# Patient Record
Sex: Male | Born: 1965 | Race: White | Hispanic: No | Marital: Married | Smoking: Never smoker
Health system: Southern US, Community
[De-identification: ages and names within clinical notes are randomized; demographics above are authoritative.]

## PROBLEM LIST (undated history)

## (undated) DIAGNOSIS — K219 Gastro-esophageal reflux disease without esophagitis: Secondary | ICD-10-CM

## (undated) DIAGNOSIS — E119 Type 2 diabetes mellitus without complications: Secondary | ICD-10-CM

## (undated) DIAGNOSIS — K760 Fatty (change of) liver, not elsewhere classified: Secondary | ICD-10-CM

## (undated) DIAGNOSIS — E785 Hyperlipidemia, unspecified: Secondary | ICD-10-CM

## (undated) DIAGNOSIS — I10 Essential (primary) hypertension: Secondary | ICD-10-CM

## (undated) HISTORY — DX: Type 2 diabetes mellitus without complications: E11.9

## (undated) HISTORY — DX: Fatty (change of) liver, not elsewhere classified: K76.0

## (undated) HISTORY — DX: Hyperlipidemia, unspecified: E78.5

## (undated) HISTORY — DX: Essential (primary) hypertension: I10

## (undated) HISTORY — DX: Gastro-esophageal reflux disease without esophagitis: K21.9

---

## 1983-02-04 HISTORY — PX: OTHER SURGICAL HISTORY: SHX169

## 1985-02-03 HISTORY — PX: SKIN GRAFT: SHX250

## 2003-02-04 DIAGNOSIS — I10 Essential (primary) hypertension: Secondary | ICD-10-CM | POA: Insufficient documentation

## 2003-11-13 ENCOUNTER — Ambulatory Visit: Payer: Self-pay

## 2004-02-04 DIAGNOSIS — E1129 Type 2 diabetes mellitus with other diabetic kidney complication: Secondary | ICD-10-CM | POA: Insufficient documentation

## 2006-02-03 DIAGNOSIS — M109 Gout, unspecified: Secondary | ICD-10-CM | POA: Insufficient documentation

## 2007-09-23 DIAGNOSIS — E782 Mixed hyperlipidemia: Secondary | ICD-10-CM | POA: Insufficient documentation

## 2010-12-17 ENCOUNTER — Ambulatory Visit: Payer: Self-pay | Admitting: Family Medicine

## 2012-03-12 ENCOUNTER — Ambulatory Visit: Payer: Self-pay | Admitting: Family Medicine

## 2012-03-12 DIAGNOSIS — K760 Fatty (change of) liver, not elsewhere classified: Secondary | ICD-10-CM | POA: Insufficient documentation

## 2012-03-12 DIAGNOSIS — N281 Cyst of kidney, acquired: Secondary | ICD-10-CM | POA: Insufficient documentation

## 2013-03-30 LAB — LIPID PANEL
CHOLESTEROL: 189 mg/dL (ref 0–200)
HDL: 53 mg/dL (ref 35–70)
LDL CALC: 109 mg/dL
TRIGLYCERIDES: 134 mg/dL (ref 40–160)

## 2013-03-30 LAB — HEPATIC FUNCTION PANEL: ALT: 57 U/L — AB (ref 10–40)

## 2013-03-30 LAB — PSA: PSA: 0.5

## 2013-10-25 IMAGING — US ABDOMEN ULTRASOUND LIMITED
1 series · 13 of 25 positions shown · non-contrast
Comparison: none

REASON FOR EXAM: Elevated LFTS
COMMENTS:

[Series 1: abdomen ultrasound limited · 0.35mm/px · 13 of 117 slices shown]
[im 1/117]
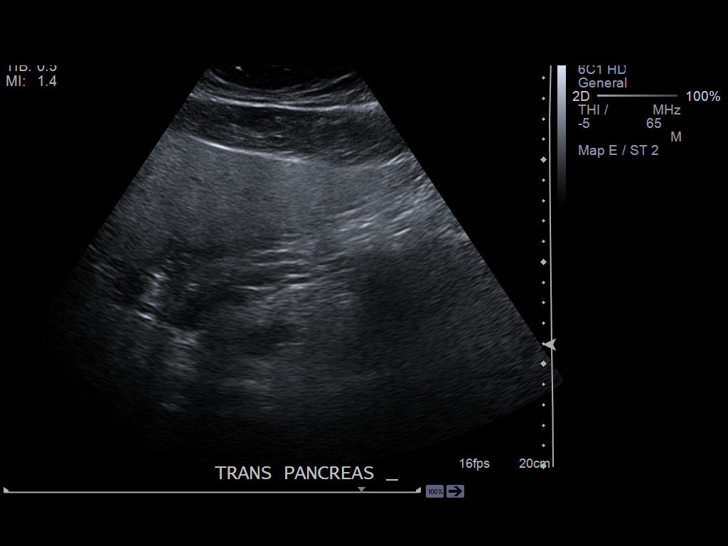
[im 10/117]
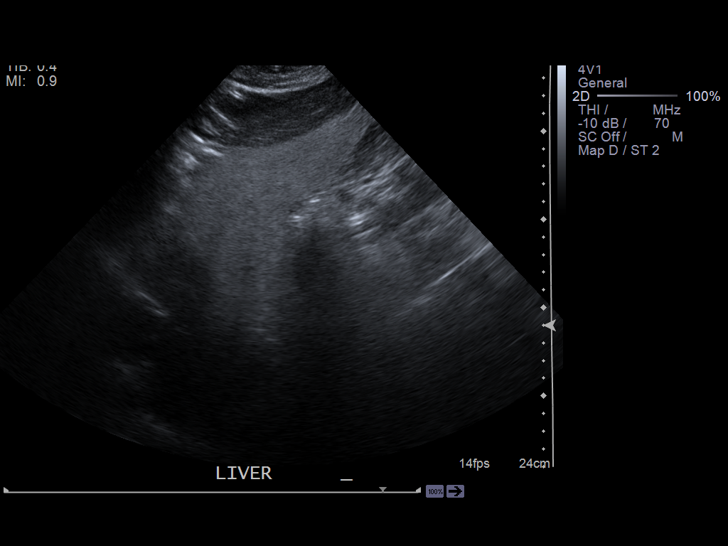
[im 20/117]
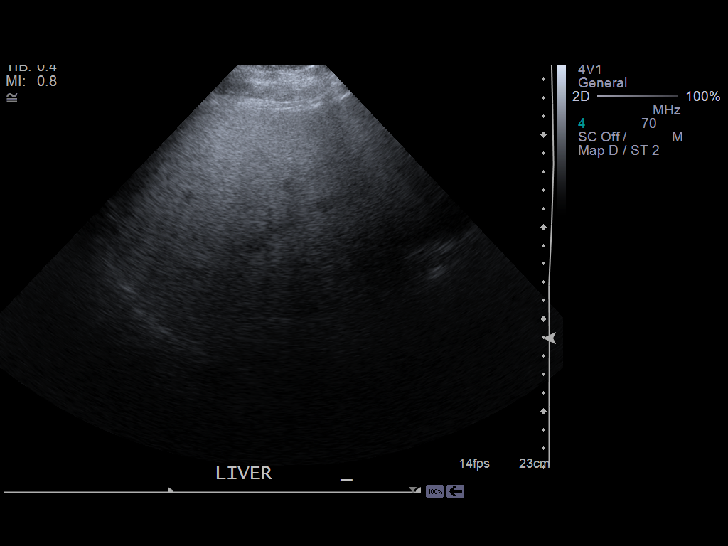
[im 30/117]
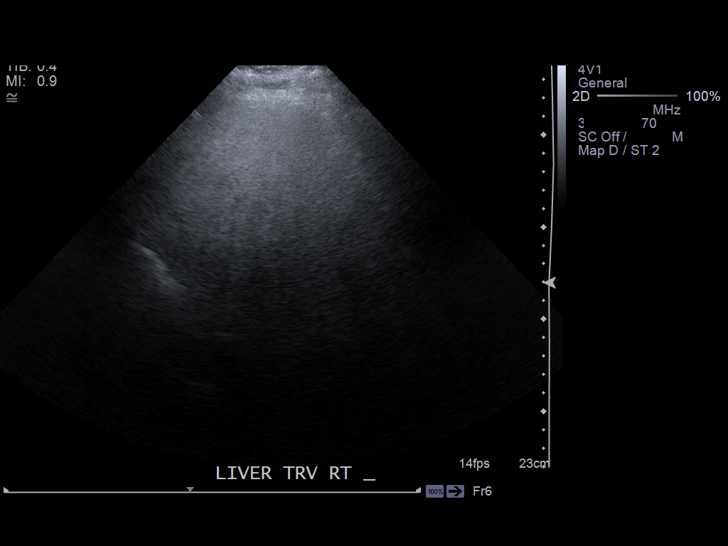
[im 39/117]
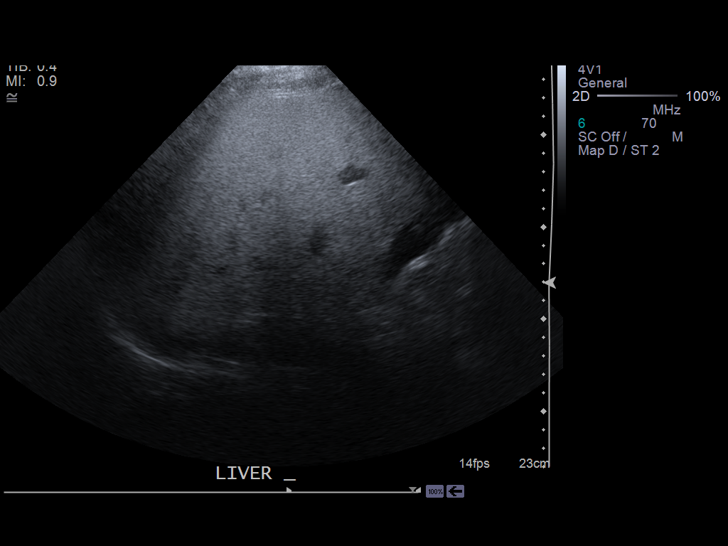
[im 49/117]
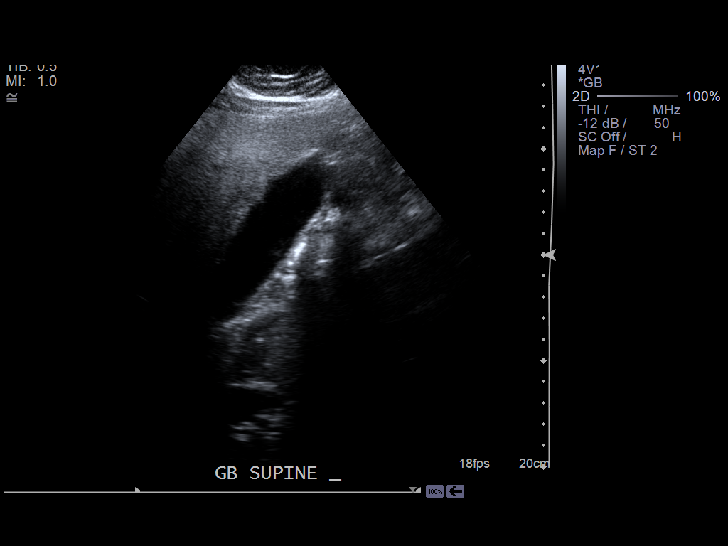
[im 59/117]
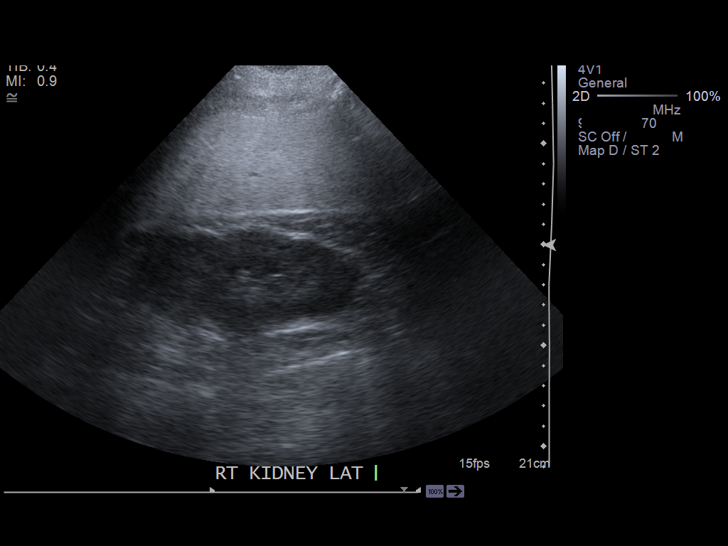
[im 68/117]
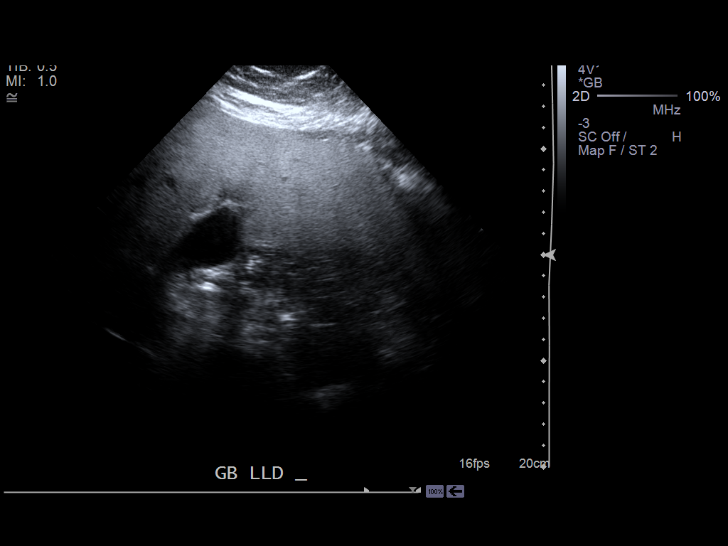
[im 78/117]
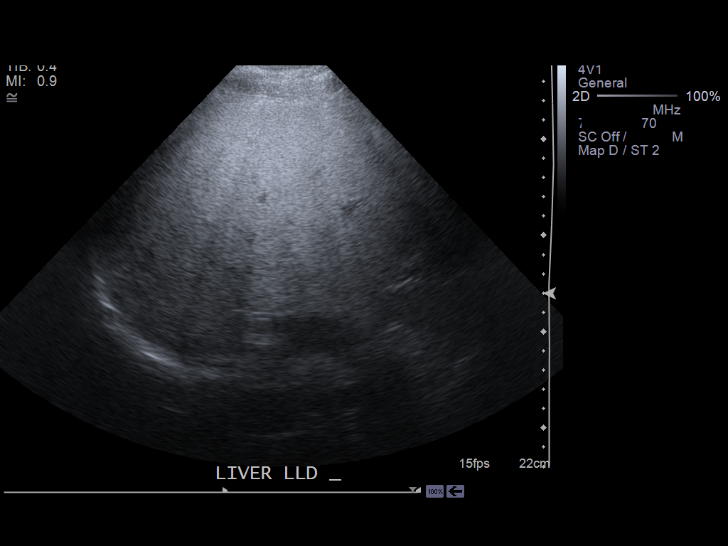
[im 88/117]
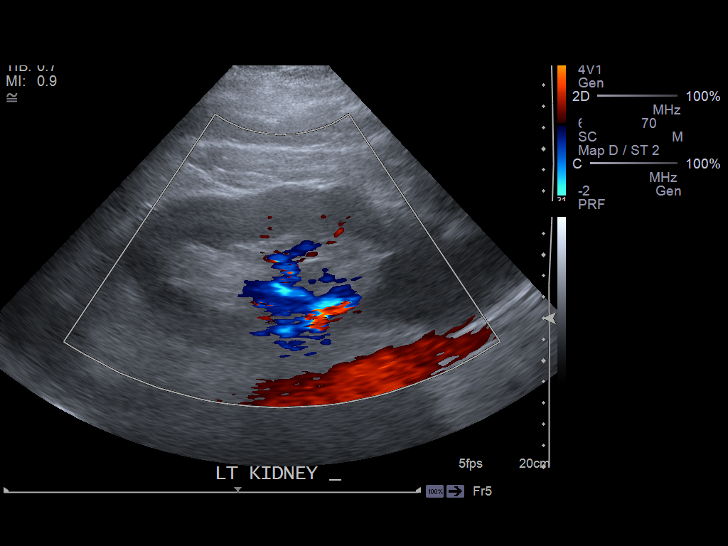
[im 97/117]
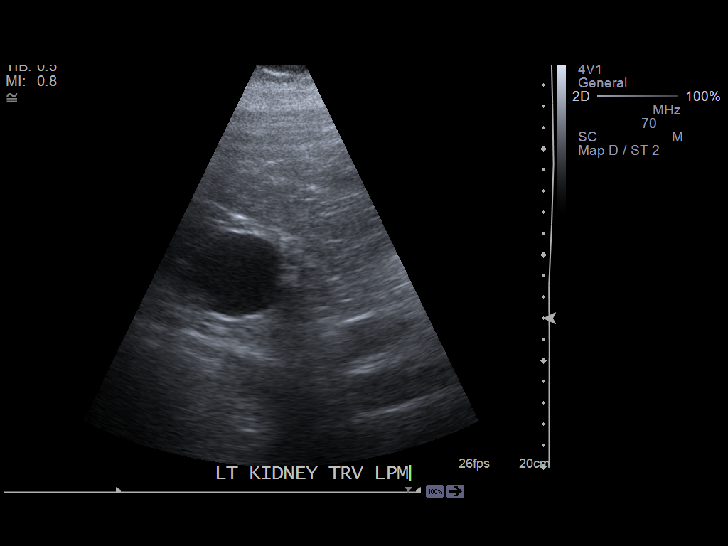
[im 107/117]
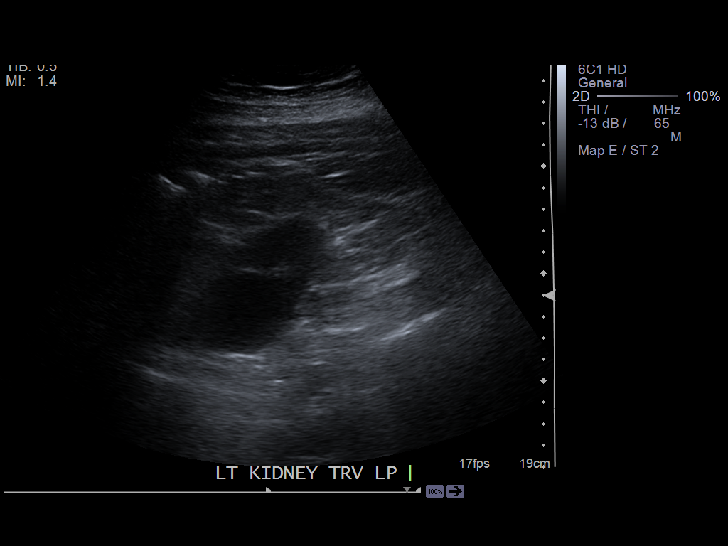
[im 117/117]
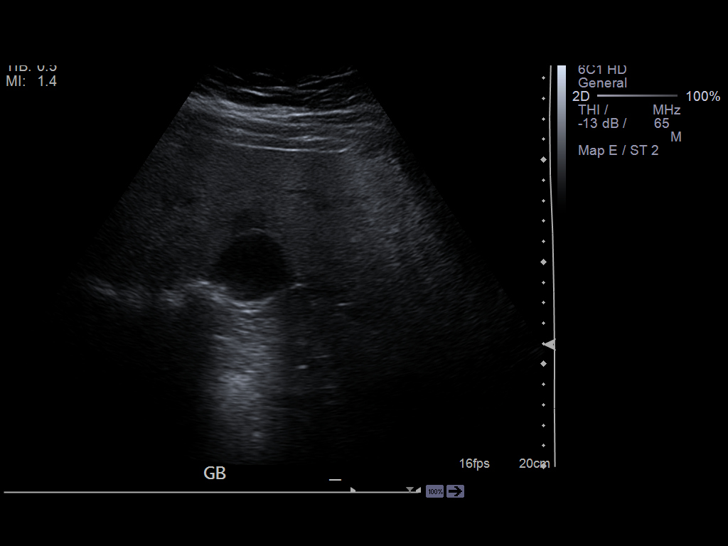

[13 of 25 positions shown; findings below may reference images not displayed]

PROCEDURE:     FUJIE - FUJIE ABDOMEN UPPER GENERAL  - March 12, 2012  [DATE]

RESULT:     The study is limited by the patient's body habitus.

The echotexture of the liver is increased suggesting fatty infiltration. The
liver is subjectively enlarged with maximal measurement of 21.6 cm. There is
no intrahepatic ductal dilation. Portal venous flow is normal in direction
toward the liver.

The gallbladder is adequately distended with no evidence of stones, wall
thickening, or pericholecystic fluid. There is no positive sonographic
Murphy's sign. The common bile duct is normal at 5.3 mm in diameter.

The pancreas was only partially demonstrated due to the presence of bowel
gas. The spleen is mildly enlarged at 14.7 cm in greatest dimension. Bowel
gas limited evaluation of the abdominal aorta.

The kidneys are normal in size and echotexture. The right kidney measures
13.6 x 5.5 x 5.6 cm. The left kidney measures 14.1 x 5.2 x 5.3 cm. There is
an exophytic cyst in the lower pole of the left kidney which measures 7.9 x
4.2 x 4.2 centimeters. There is no evidence of ascites.
IMPRESSION: 1. The liver is mildly enlarged and demonstrates increased echotexture
suggesting fatty infiltration.
2. The gallbladder and visualized portions of the pancreas are normal.
3. There is mild splenomegaly to 14.7 cm.
4. There is a large exophytic cyst associated with the lower pole of the
left kidney measuring 7.9 cm in greatest dimension.

[REDACTED]

## 2013-12-08 LAB — HEMOGLOBIN A1C: HEMOGLOBIN A1C: 9.4

## 2013-12-27 LAB — BASIC METABOLIC PANEL
BUN: 11 mg/dL (ref 4–21)
Creatinine: 0.9 mg/dL (ref 0.6–1.3)
Glucose: 144 mg/dL
Potassium: 4.7 mmol/L (ref 3.4–5.3)
Sodium: 138 mmol/L (ref 137–147)

## 2014-07-24 ENCOUNTER — Telehealth: Payer: Self-pay | Admitting: Family Medicine

## 2014-07-24 MED ORDER — EMPAGLIFLOZIN 25 MG PO TABS: 25.0000 mg | ORAL_TABLET | Freq: Every day | ORAL | Status: DC

## 2014-07-24 NOTE — Telephone Encounter (Signed)
Pt stated that his insurance no longer covers Invokana 300 mg and pt would like a RX for Jardiance be sent to [facility] today if possible. Thanks TNP

## 2014-08-04 ENCOUNTER — Other Ambulatory Visit: Payer: Self-pay | Admitting: Family Medicine

## 2014-08-04 DIAGNOSIS — E1129 Type 2 diabetes mellitus with other diabetic kidney complication: Secondary | ICD-10-CM

## 2014-08-04 NOTE — Telephone Encounter (Signed)
Pharm[facility] request refill. [facility]

## 2014-08-06 ENCOUNTER — Other Ambulatory Visit: Payer: Self-pay | Admitting: Family Medicine

## 2014-10-12 ENCOUNTER — Other Ambulatory Visit: Payer: Self-pay | Admitting: Family Medicine

## 2014-10-31 ENCOUNTER — Other Ambulatory Visit: Payer: Self-pay | Admitting: Family Medicine

## 2014-12-03 ENCOUNTER — Other Ambulatory Visit: Payer: Self-pay | Admitting: Family Medicine

## 2015-01-02 LAB — HM DIABETES EYE EXAM

## 2015-01-10 ENCOUNTER — Encounter: Payer: Self-pay | Admitting: *Deleted

## 2015-01-16 ENCOUNTER — Other Ambulatory Visit: Payer: Self-pay | Admitting: Family Medicine

## 2015-01-16 NOTE — Telephone Encounter (Signed)
Patient is overdue for follow up o.v.  Needs to schedule before refills can be approved.

## 2015-01-19 ENCOUNTER — Other Ambulatory Visit: Payer: Self-pay | Admitting: *Deleted

## 2015-01-19 ENCOUNTER — Encounter: Payer: Self-pay | Admitting: Family Medicine

## 2015-01-19 NOTE — Telephone Encounter (Signed)
Last ov 05/11/2014.

## 2015-01-22 MED ORDER — ONETOUCH VERIO VI STRP: ORAL_STRIP | Status: DC

## 2015-01-22 MED ORDER — EMPAGLIFLOZIN 25 MG PO TABS: 25.0000 mg | ORAL_TABLET | Freq: Every day | ORAL | Status: DC

## 2015-01-24 ENCOUNTER — Other Ambulatory Visit: Payer: Self-pay | Admitting: Family Medicine

## 2015-01-25 NOTE — Telephone Encounter (Signed)
Patient is requesting a 90 day supply of [facility]. Patient states he went to pick up the RX but it was only a 30 day supply which cost the same as a 90 day supply.

## 2015-01-26 ENCOUNTER — Telehealth: Payer: Self-pay | Admitting: Emergency Medicine

## 2015-01-26 ENCOUNTER — Other Ambulatory Visit: Payer: Self-pay | Admitting: Family Medicine

## 2015-01-26 MED ORDER — EMPAGLIFLOZIN 25 MG PO TABS: 25.0000 mg | ORAL_TABLET | Freq: Every day | ORAL | Status: DC

## 2015-01-26 NOTE — Telephone Encounter (Signed)
Pt called back again regarding his 90 day supply for Jardiance 25mg . It was sent in for 30 days again. I saw that you said he needed an appointment. I informed pt and he scheduled an appointment and then asked again if he could the 90 day supply. Pt states "Ok fine ill make an appointment and then he can send in my 5290 pills" His appt is 02/28/15. He says that it is the same cost to get 90 as it is 30 pills. Pt states "If he can not do that after I made an appointment, then I need to speak with him, because this is my fourth time call about this."

## 2015-01-26 NOTE — Telephone Encounter (Signed)
Patient is completeley out of medication. He reports that he needs 90 days of Jardiance 25mg  sent in because it costs the same as a 30 day supply. [facility]. Thanks!

## 2015-01-26 NOTE — Telephone Encounter (Signed)
Please see message phone message from earlier today.  Thanks,   -Vernona RiegerLaura

## 2015-02-22 DIAGNOSIS — K219 Gastro-esophageal reflux disease without esophagitis: Secondary | ICD-10-CM | POA: Insufficient documentation

## 2015-02-22 DIAGNOSIS — G5620 Lesion of ulnar nerve, unspecified upper limb: Secondary | ICD-10-CM | POA: Insufficient documentation

## 2015-02-28 ENCOUNTER — Encounter: Payer: Self-pay | Admitting: Family Medicine

## 2015-02-28 ENCOUNTER — Ambulatory Visit (INDEPENDENT_AMBULATORY_CARE_PROVIDER_SITE_OTHER): Payer: 59 | Admitting: Family Medicine

## 2015-02-28 VITALS — BP 126/60 | HR 72 | Temp 98.0°F | Resp 16 | Wt 298.0 lb

## 2015-02-28 DIAGNOSIS — E782 Mixed hyperlipidemia: Secondary | ICD-10-CM

## 2015-02-28 DIAGNOSIS — E1129 Type 2 diabetes mellitus with other diabetic kidney complication: Secondary | ICD-10-CM | POA: Diagnosis not present

## 2015-02-28 DIAGNOSIS — E1165 Type 2 diabetes mellitus with hyperglycemia: Secondary | ICD-10-CM

## 2015-02-28 DIAGNOSIS — IMO0002 Reserved for concepts with insufficient information to code with codable children: Secondary | ICD-10-CM

## 2015-02-28 DIAGNOSIS — Z794 Long term (current) use of insulin: Secondary | ICD-10-CM

## 2015-02-28 DIAGNOSIS — K760 Fatty (change of) liver, not elsewhere classified: Secondary | ICD-10-CM | POA: Diagnosis not present

## 2015-02-28 DIAGNOSIS — I10 Essential (primary) hypertension: Secondary | ICD-10-CM

## 2015-02-28 LAB — POCT GLYCOSYLATED HEMOGLOBIN (HGB A1C)
ESTIMATED AVERAGE GLUCOSE: 146
HEMOGLOBIN A1C: 6.7

## 2015-02-28 NOTE — Progress Notes (Signed)
Patient ID: Christopher Wise, male   DOB: October 08, 1965, 50 y.o.   MRN: [id number]       Patient: Christopher Wise Male    DOB: [birth year]   50 y.o.   MRN: [id number] Visit Date: 02/28/2015  Today's Provider: Mila Merry, MD   Chief Complaint  Patient presents with  . Hyperlipidemia  . Gastroesophageal Reflux  . Diabetes  . Hypertension   Subjective:    HPI  Diabetes Mellitus Type II, Follow-up:   Lab Results  Component Value Date   HGBA1C 6.7 02/28/2015   HGBA1C 9.4 12/08/2013   Last seen for diabetes 9 months ago.  Management since then includes no changes. Patient reports that he will need to change the Lantus due to it not being covered by his insurance.  He reports good compliance with treatment. He is not having side effects.  Home blood sugar records: "not good" per patient.  Episodes of hypoglycemia? no   Current Insulin Regimen: daily  Most Recent Eye Exam: over 1 year ago.  Weight trend: stable Prior visit with dietician: no Current diet: well balanced Current exercise: none  ------------------------------------------------------------------------   Hypertension, follow-up:  BP Readings from Last 3 Encounters:  02/28/15 126/60  05/11/14 130/80    He was last seen for hypertension 9 months ago.  BP at that visit was 130/80. Management since that visit includes no changes .He reports good compliance with treatment. He is not having side effects.  He is not exercising. He is not adherent to low salt diet.   Outside blood pressures are not being checked. Patient denies chest pain and fatigue.   Cardiovascular risk factors include diabetes mellitus and dyslipidemia.    ------------------------------------------------------------------------    Lipid/Cholesterol, Follow-up:   Last seen for this 9 months ago.  Management since that visit includes no changes.  Last Lipid Panel:    Component Value Date/Time   CHOL 189 03/30/2013   TRIG 134  03/30/2013   HDL 53 03/30/2013   LDLCALC 109 03/30/2013    He reports good compliance with treatment. He is not having side effects.   Wt Readings from Last 3 Encounters:  02/28/15 298 lb (135.172 kg)  05/11/14 296 lb (134.265 kg)    ------------------------------------------------------------------------      No Known Allergies Previous Medications   ALLOPURINOL (ZYLOPRIM) 100 MG TABLET    Take 1 tablet by mouth daily.   ASPIRIN 81 MG TABLET    Take 1 tablet by mouth daily.   ATORVASTATIN (LIPITOR) 40 MG TABLET    TAKE 1 TABLET BY MOUTH DAILY   CANAGLIFLOZIN (INVOKANA) 300 MG TABS TABLET    Take 1 tablet by mouth daily.   CHOLECALCIFEROL (VITAMIN D) 2000 UNITS CAPS    Take 1 capsule by mouth daily.   EMPAGLIFLOZIN (JARDIANCE) 25 MG TABS TABLET    Take 25 mg by mouth daily.   GLIMEPIRIDE (AMARYL) 4 MG TABLET    TAKE 1 TABLET BY MOUTH ONCE A DAY   INDAPAMIDE (LOZOL) 2.5 MG TABLET    TAKE 1 TABLET BY MOUTH DAILY   INSULIN ASPART (NOVOLOG FLEXPEN) 100 UNIT/ML FLEXPEN    Inject 10 Units into the skin as needed. Before meals for blood sugar >200   LANTUS SOLOSTAR 100 UNIT/ML SOLOSTAR PEN    INJECT UP TO 50 UNITS SUBCUTANEOUSLY TITRATED AS DIRECTED EACH DAY   LOSARTAN (COZAAR) 100 MG TABLET    Take 1 tablet by mouth daily.   METFORMIN (GLUCOPHAGE) 500 MG TABLET  TAKE 2 TABLETS BY MOUTH TWICE A DAY   OMEGA-3 FATTY ACIDS (FISH OIL) 1000 MG CAPS    Take 1 capsule by mouth daily.   OMEPRAZOLE (PRILOSEC) 20 MG CAPSULE    TAKE ONE CAPSULE BY MOUTH DAILY   ONETOUCH VERIO TEST STRIP    TEST 3 TIMES A DAY AS DIRECTED   PREDNISONE (DELTASONE) 10 MG TABLET    Take 1 tablet by mouth. As directed   VITAMIN B-12 (CYANOCOBALAMIN) 1000 MCG TABLET    Take 1 tablet by mouth daily.    Review of Systems  Constitutional: Negative.   Respiratory: Negative.   Cardiovascular: Negative.   Endocrine: Negative.   Musculoskeletal: Negative.   Neurological: Negative.     Social History  Substance Use  Topics  . Smoking status: Never Smoker   . Smokeless tobacco: Not on file  . Alcohol Use: 0.0 oz/week    0 Standard drinks or equivalent per week     Comment: Moderate use   Objective:   BP 126/60 mmHg  Pulse 72  Temp(Src) 98 F (36.7 C)  Resp 16  Wt 298 lb (135.172 kg)  Physical Exam  General Appearance:    Alert, cooperative, no distress, obese  Eyes:    PERRL, conjunctiva/corneas clear, EOM's intact       Lungs:     Clear to auscultation bilaterally, respirations unlabored  Heart:    Regular rate and rhythm  Neurologic:   Awake, alert, oriented x 3. No apparent focal neurological           defect.        Results for orders placed or performed in visit on 02/28/15  POCT glycosylated hemoglobin (Hb A1C)  Result Value Ref Range   Hemoglobin A1C 6.7    Est. average glucose Bld gHb Est-mCnc 146        Assessment & Plan:     1. 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 Continue current medications.  - POCT glycosylated hemoglobin (Hb A1C)  2. Essential (primary) hypertension Well controlled.  Continue current medications.   - Renal function panel  3. Fatty infiltration of liver  - Hepatic function panel  4. Hyperlipidemia, mixed He is tolerating atorvastatin well with no adverse effects.   - Lipid panel  Follow up 6 months.       Mila Merry, MD  [facility]

## 2015-03-02 ENCOUNTER — Telehealth: Payer: Self-pay

## 2015-03-02 LAB — RENAL FUNCTION PANEL
Albumin: 4.5 g/dL (ref 3.5–5.5)
BUN/Creatinine Ratio: 11 (ref 9–20)
BUN: 8 mg/dL (ref 6–24)
CALCIUM: 9.8 mg/dL (ref 8.7–10.2)
CHLORIDE: 98 mmol/L (ref 96–106)
CO2: 26 mmol/L (ref 18–29)
CREATININE: 0.75 mg/dL — AB (ref 0.76–1.27)
GFR calc Af Amer: 123 mL/min/{1.73_m2} (ref >59)
GFR calc non Af Amer: 106 mL/min/{1.73_m2} (ref >59)
Glucose: 96 mg/dL (ref 65–99)
PHOSPHORUS: 3.1 mg/dL (ref 2.5–4.5)
Potassium: 4.6 mmol/L (ref 3.5–5.2)
SODIUM: 140 mmol/L (ref 134–144)

## 2015-03-02 LAB — HEPATIC FUNCTION PANEL
ALT: 62 IU/L — ABNORMAL HIGH (ref 0–44)
AST: 42 IU/L — AB (ref 0–40)
Alkaline Phosphatase: 89 IU/L (ref 39–117)
BILIRUBIN TOTAL: 0.4 mg/dL (ref 0.0–1.2)
Bilirubin, Direct: 0.13 mg/dL (ref 0.00–0.40)
TOTAL PROTEIN: 7 g/dL (ref 6.0–8.5)

## 2015-03-02 LAB — LIPID PANEL
CHOL/HDL RATIO: 3.2 ratio (ref 0.0–5.0)
CHOLESTEROL TOTAL: 170 mg/dL (ref 100–199)
HDL: 53 mg/dL (ref >39)
LDL Calculated: 86 mg/dL (ref 0–99)
TRIGLYCERIDES: 153 mg/dL — AB (ref 0–149)
VLDL Cholesterol Cal: 31 mg/dL (ref 5–40)

## 2015-03-02 NOTE — Telephone Encounter (Signed)
-----   Message from Malva Limes, MD sent at 03/02/2015  8:08 AM EST ----- Cholesterol is well controlled at 170. Normal kidney functions and electrolytes. Liver functions slightly elevated, but stable. Continue current medications.  Follow up 6 months.

## 2015-03-02 NOTE — Telephone Encounter (Signed)
Advised patient of results.  

## 2015-03-02 NOTE — Telephone Encounter (Signed)
Left message to call back  

## 2015-03-08 ENCOUNTER — Other Ambulatory Visit: Payer: Self-pay | Admitting: Family Medicine

## 2015-03-08 NOTE — Telephone Encounter (Signed)
Pt contacted office for refill request on the following medications: Pt stated that his insurance won't pay for Lantus and [facility] and the following medications:  Allopurinol atorvastatin (LIPITOR) 40 MG tablet empagliflozin (JARDIANCE) 25 MG TABS tablet glimepiride (AMARYL) 4 MG tablet  indapamide (LOZOL) 2.5 MG tablet insulin aspart (NOVOLOG FLEXPEN) 100 UNIT/ML FlexPen losartan (COZAAR) 100 MG tablet metFORMIN (GLUCOPHAGE) 500 MG tablet omeprazole (PRILOSEC) 20 MG capsule  ONETOUCH VERIO test strip  predniSONE (DELTASONE) 10 MG tablet To [facility] Thanks TNP

## 2015-03-08 NOTE — Telephone Encounter (Signed)
Pt stated that his insurance won't pay for Lantus and needs Basaglar.

## 2015-03-09 MED ORDER — GLIMEPIRIDE 4 MG PO TABS: 4.0000 mg | ORAL_TABLET | Freq: Every day | ORAL | Status: AC

## 2015-03-09 MED ORDER — INSULIN ASPART 100 UNIT/ML FLEXPEN: 10.0000 [IU] | PEN_INJECTOR | SUBCUTANEOUS | Status: AC | PRN

## 2015-03-09 MED ORDER — ATORVASTATIN CALCIUM 40 MG PO TABS: 40.0000 mg | ORAL_TABLET | Freq: Every day | ORAL | Status: AC

## 2015-03-09 MED ORDER — PREDNISONE 10 MG PO TABS: ORAL_TABLET | ORAL | Status: AC

## 2015-03-09 MED ORDER — INDAPAMIDE 2.5 MG PO TABS: 2.5000 mg | ORAL_TABLET | Freq: Every day | ORAL | Status: AC

## 2015-03-09 MED ORDER — LOSARTAN POTASSIUM 100 MG PO TABS: 100.0000 mg | ORAL_TABLET | Freq: Every day | ORAL | Status: AC

## 2015-03-09 MED ORDER — METFORMIN HCL 500 MG PO TABS: 1000.0000 mg | ORAL_TABLET | Freq: Two times a day (BID) | ORAL | Status: AC

## 2015-03-09 MED ORDER — EMPAGLIFLOZIN 25 MG PO TABS: 25.0000 mg | ORAL_TABLET | Freq: Every day | ORAL | Status: AC

## 2015-03-09 NOTE — Telephone Encounter (Signed)
Pt has called back wanting to kow about his prescriptions being sent in.

## 2015-03-19 ENCOUNTER — Telehealth: Payer: Self-pay | Admitting: *Deleted

## 2015-03-19 MED ORDER — INSULIN GLARGINE 100 UNIT/ML SOLOSTAR PEN: PEN_INJECTOR | SUBCUTANEOUS | Status: DC

## 2015-03-19 NOTE — Telephone Encounter (Signed)
Pt stated that his insurance won't pay for Lantus and needs Basaglar sent in to [facility] [address]. Patient stated that he is almost out.

## 2015-03-20 ENCOUNTER — Telehealth: Payer: Self-pay | Admitting: Family Medicine

## 2015-03-20 MED ORDER — INSULIN GLARGINE 100 UNIT/ML SOLOSTAR PEN: PEN_INJECTOR | SUBCUTANEOUS | Status: AC

## 2015-03-20 NOTE — Telephone Encounter (Signed)
Pt contacted office for refill request on the following medications:  Insulin Glargine (BASAGLAR KWIKPEN) 100 UNIT/ML Solostar Pen.  Pt is request the dosage changed 70 units with a 90 day supply and Rx resent for refill.  [facility].  CB#[phone]/MW

## 2015-03-20 NOTE — Telephone Encounter (Signed)
Please advise 

## 2015-05-05 ENCOUNTER — Other Ambulatory Visit: Payer: Self-pay | Admitting: Family Medicine

## 2015-07-05 DEATH — deceased

## 2015-09-28 ENCOUNTER — Ambulatory Visit: Payer: 59 | Admitting: Family Medicine
